# Patient Record
Sex: Female | Born: 1990 | Hispanic: Yes | Marital: Single | Smoking: Never smoker
Health system: Southern US, Community
[De-identification: ages and names within clinical notes are randomized; demographics above are authoritative.]

---
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
## 2011-04-29 ENCOUNTER — Emergency Department: Payer: Self-pay | Admitting: *Deleted

## 2011-04-29 LAB — URINALYSIS, COMPLETE
Bilirubin,UR: NEGATIVE
Ketone: NEGATIVE
Nitrite: POSITIVE
Ph: 5 (ref 4.5–8.0)
Protein: 30
RBC,UR: 58 /HPF (ref 0–5)
Specific Gravity: 1.017 (ref 1.003–1.030)
Squamous Epithelial: 13

## 2011-04-29 LAB — CBC
MCH: 29.9 pg (ref 26.0–34.0)
MCHC: 33.8 g/dL (ref 32.0–36.0)
Platelet: 216 10*3/uL (ref 150–440)
RBC: 4.7 10*6/uL (ref 3.80–5.20)
RDW: 12.8 % (ref 11.5–14.5)

## 2011-04-29 LAB — BASIC METABOLIC PANEL
Anion Gap: 11 (ref 7–16)
BUN: 13 mg/dL (ref 7–18)
Chloride: 105 mmol/L (ref 98–107)
Co2: 26 mmol/L (ref 21–32)
Osmolality: 283 (ref 275–301)
Potassium: 3.9 mmol/L (ref 3.5–5.1)

## 2011-04-29 LAB — PREGNANCY, URINE: Pregnancy Test, Urine: NEGATIVE m[IU]/mL

## 2011-05-02 LAB — URINE CULTURE

## 2017-07-26 ENCOUNTER — Ambulatory Visit (HOSPITAL_COMMUNITY)
Admission: EM | Admit: 2017-07-26 | Discharge: 2017-07-26 | Disposition: A | Discharge status: Home / Self Care | Payer: BLUE CROSS/BLUE SHIELD | Attending: Internal Medicine | Admitting: Internal Medicine

## 2017-07-26 ENCOUNTER — Other Ambulatory Visit: Payer: Self-pay

## 2017-07-26 ENCOUNTER — Encounter (HOSPITAL_COMMUNITY): Payer: Self-pay | Admitting: Emergency Medicine

## 2017-07-26 DIAGNOSIS — S61452A Open bite of left hand, initial encounter: Secondary | ICD-10-CM | POA: Diagnosis not present

## 2017-07-26 DIAGNOSIS — W5501XA Bitten by cat, initial encounter: Secondary | ICD-10-CM | POA: Diagnosis not present

## 2017-07-26 MED ORDER — DOXYCYCLINE HYCLATE 100 MG PO CAPS: 100.0000 mg | ORAL_CAPSULE | Freq: Two times a day (BID) | ORAL | 0 refills | Status: AC

## 2017-07-26 NOTE — Discharge Instructions (Signed)
Complete course of antibiotics.  Wash wound daily with soap and water. Please return if any worsening of symptoms, redness, swelling, pain, drainage

## 2017-07-26 NOTE — ED Triage Notes (Signed)
[facility] a [facility] of a cat bite to her left hand. The patient reported that it was her cat.

## 2017-07-26 NOTE — ED Provider Notes (Signed)
[facility]    CSN: [id number] Arrival date & time: 07/26/17  1840     History   Chief Complaint Chief Complaint  Patient presents with  . Animal Bite    HPI Stacy Todd is a 27 y.o. female.   Stacy Todd presents with complaints of a catbite to her left palm. She was washing her cat when it bit her this morning at 11am. cat is vaccinated. She cleansed the wound. Last tetanus was in the past year. States feels more tender to the affected area. Without contributing medical history.    ROS per HPI.      History reviewed. No pertinent past medical history.  There are no active problems to display for this patient.   History reviewed. No pertinent surgical history.  OB History   None      Home Medications    Prior to Admission medications   Medication Sig Start Date End Date Taking? Authorizing Provider  doxycycline (VIBRAMYCIN) 100 MG capsule Take 1 capsule (100 mg total) by mouth 2 (two) times daily for 7 days. 07/26/17 08/02/17  Georgetta HaberBurky, Lulla Linville B, NP    Family History History reviewed. No pertinent family history.  Social History Social History   Tobacco Use  . Smoking status: Never Smoker  . Smokeless tobacco: Never Used  Substance Use Topics  . Alcohol use: Never    Frequency: Never  . Drug use: Never     Allergies   Penicillins   Review of Systems Review of Systems   Physical Exam Triage Vital Signs ED Triage Vitals  Enc Vitals Group     BP 07/26/17 2004 (!) 129/56     Pulse Rate 07/26/17 2004 64     Resp 07/26/17 2004 16     Temp 07/26/17 2004 98.9 F (37.2 C)     Temp Source 07/26/17 2004 Oral     SpO2 07/26/17 2004 100 %     Weight --      Height --      Head Circumference --      Peak Flow --      Pain Score 07/26/17 2008 4     Pain Loc --      Pain Edu? --      Excl. in GC? --    No data found.  Updated Vital Signs BP (!) 129/56 (BP Location: Left Arm)   Pulse 64   Temp 98.9 F (37.2 C) (Oral)   Resp 16    LMP 07/20/2017   SpO2 100%   Visual Acuity Right Eye Distance:   Left Eye Distance:   Bilateral Distance:    Right Eye Near:   Left Eye Near:    Bilateral Near:     Physical Exam  Constitutional: She is oriented to person, place, and time. She appears well-developed and well-nourished. No distress.  Cardiovascular: Normal rate, regular rhythm and normal heart sounds.  Pulmonary/Chest: Effort normal and breath sounds normal.  Musculoskeletal:       Hands: Approximately 1cm superficial cut to palm of hand. Without any depth; no active bleeding; full ROM to hand, fingers, wrist; cap refill <2 seconds  Neurological: She is alert and oriented to person, place, and time.  Skin: Skin is warm and dry.     UC Treatments / Results  Labs (all labs ordered are listed, but only abnormal results are displayed) Labs Reviewed - No data to display  EKG None  Radiology No results found.  Procedures Procedures (including  critical care time)  Medications Ordered in UC Medications - No data to display  Initial Impression / Assessment and Plan / UC Course  I have reviewed the triage vital signs and the nursing notes.  Pertinent labs & imaging results that were available during my care of the patient were reviewed by me and considered in my medical decision making (see chart for details).     Prophylactic augmentin prescribed with strict return precautions discussed. Wound care discussed. Patient verbalized understanding and agreeable to plan.    Final Clinical Impressions(s) / UC Diagnoses   Final diagnoses:  Cat bite, initial encounter     Discharge Instructions     Complete course of antibiotics.  Wash wound daily with soap and water. Please return if any worsening of symptoms, redness, swelling, pain, drainage   ED Prescriptions    Medication Sig Dispense Auth. Provider   doxycycline (VIBRAMYCIN) 100 MG capsule Take 1 capsule (100 mg total) by mouth 2 (two) times daily  for 7 days. 14 capsule Georgetta Haber, NP     Controlled Substance Prescriptions [place] Controlled Substance Registry consulted? Not Applicable   Georgetta Haber, NP 07/26/17 2038

## 2020-09-22 ENCOUNTER — Other Ambulatory Visit: Payer: Self-pay | Admitting: Orthopedic Surgery

## 2020-09-22 ENCOUNTER — Other Ambulatory Visit (HOSPITAL_COMMUNITY): Payer: Self-pay | Admitting: Orthopedic Surgery

## 2020-09-22 DIAGNOSIS — S8391XD Sprain of unspecified site of right knee, subsequent encounter: Secondary | ICD-10-CM

## 2020-09-22 DIAGNOSIS — M2351 Chronic instability of knee, right knee: Secondary | ICD-10-CM

## 2020-09-22 DIAGNOSIS — M2391 Unspecified internal derangement of right knee: Secondary | ICD-10-CM

## 2020-10-03 ENCOUNTER — Other Ambulatory Visit: Payer: Self-pay

## 2020-10-03 ENCOUNTER — Ambulatory Visit
Admission: RE | Admit: 2020-10-03 | Discharge: 2020-10-03 | Disposition: A | Discharge status: Home / Self Care | Payer: BC Managed Care – PPO | Source: Ambulatory Visit | Attending: Orthopedic Surgery | Admitting: Orthopedic Surgery

## 2020-10-03 DIAGNOSIS — S8391XD Sprain of unspecified site of right knee, subsequent encounter: Secondary | ICD-10-CM | POA: Diagnosis not present

## 2020-10-03 DIAGNOSIS — M2351 Chronic instability of knee, right knee: Secondary | ICD-10-CM | POA: Insufficient documentation

## 2020-10-03 DIAGNOSIS — M2391 Unspecified internal derangement of right knee: Secondary | ICD-10-CM | POA: Insufficient documentation

## 2022-01-24 ENCOUNTER — Other Ambulatory Visit: Payer: Self-pay

## 2022-01-24 IMAGING — MR MR KNEE*R* W/O CM
7 series · 40 of 40 positions shown · non-contrast
Comparison: None.

CLINICAL DATA: Anterior knee pain with popping for 2 weeks. No
known injury.

EXAM:
MRI OF THE RIGHT KNEE WITHOUT CONTRAST
TECHNIQUE: Multiplanar, multisequence MR imaging of the knee was performed. No
intravenous contrast was administered.

[Series 8: T2 fat-sat · axial · right · 4.0mm · 0.50mm/px · z∈[-56,+68]mm · 6 of 26 slices shown (1 of 3)]
[im 1/26]
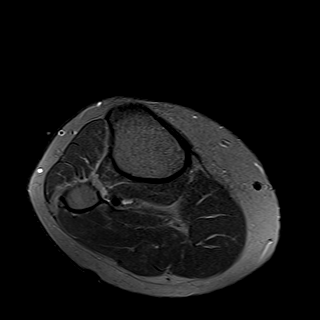
[im 6/26]
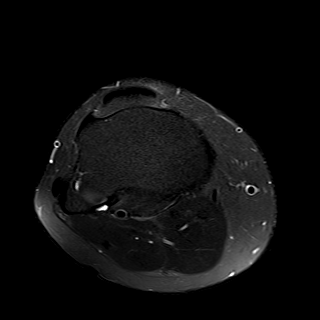
[im 11/26]
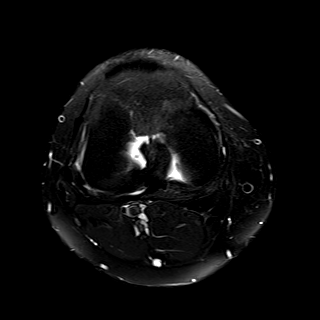
[im 16/26]
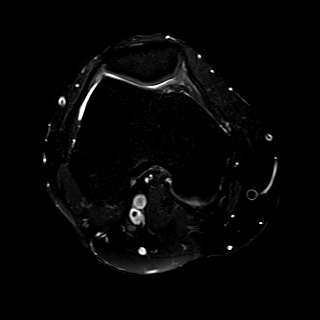
[im 21/26]
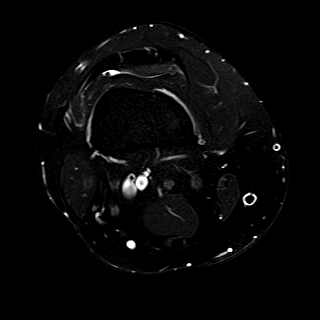
[im 26/26]
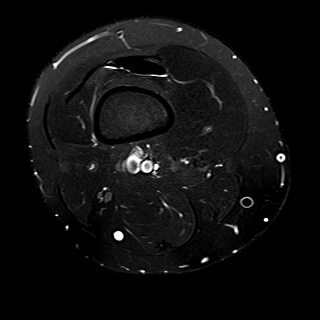

[Series 9: T1 · coronal · right · 4.0mm · 0.47mm/px · 6 of 30 slices shown]
[im 1/30]
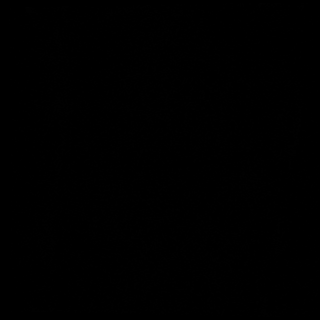
[im 6/30]
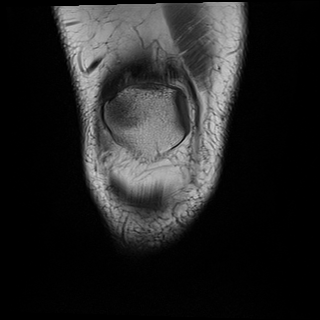
[im 12/30]
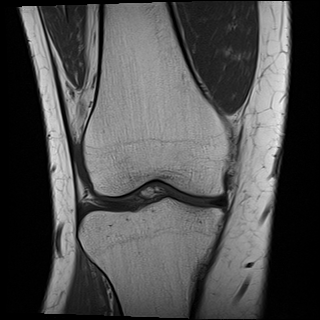
[im 18/30]
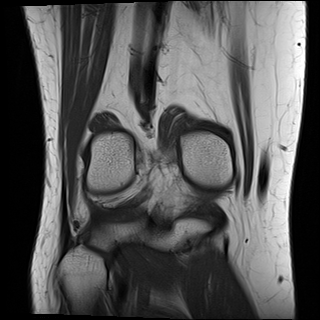
[im 24/30]
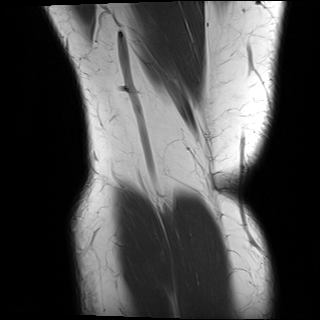
[im 30/30]
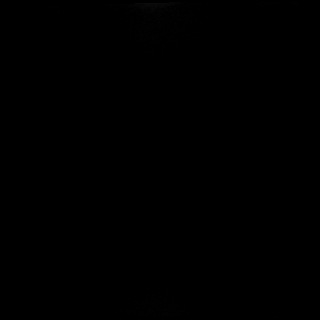

[Series 10: T2 fat-sat · coronal · right · 4.0mm · 0.47mm/px · 6 of 29 slices shown (2 of 3)]
[im 1/29]
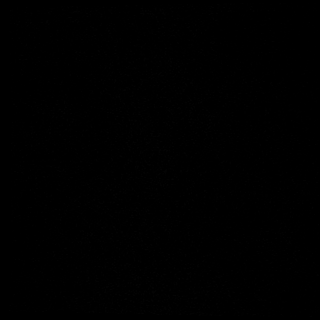
[im 6/29]
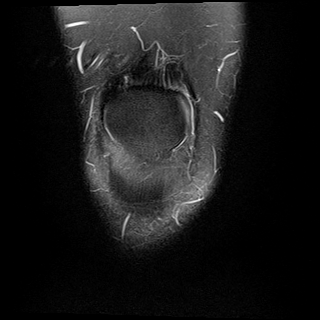
[im 12/29]
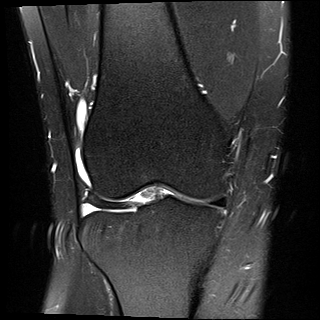
[im 17/29]
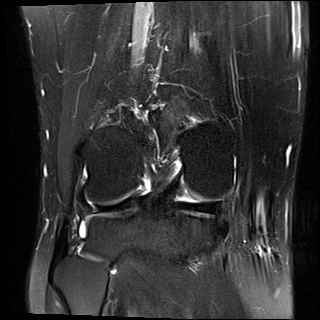
[im 23/29]
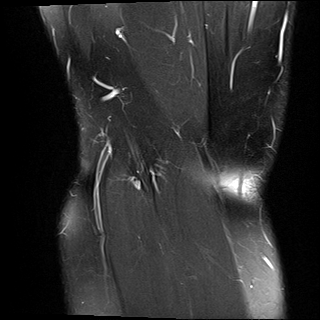
[im 29/29]
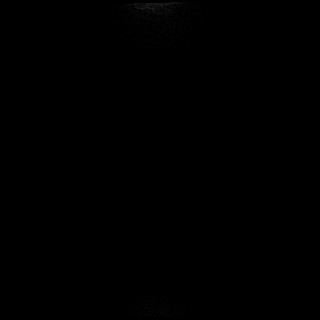

[Series 11: PD fat-sat · coronal · right · 4.0mm · 0.59mm/px · 6 of 29 slices shown (1 of 2)]
[im 1/29]
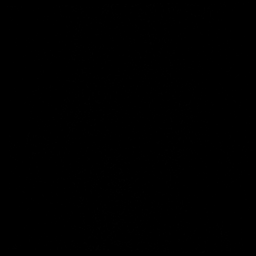
[im 6/29]
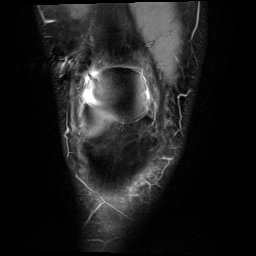
[im 12/29]
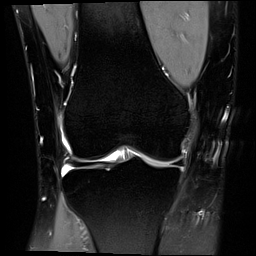
[im 17/29]
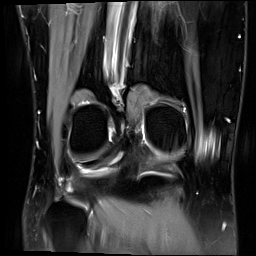
[im 23/29]
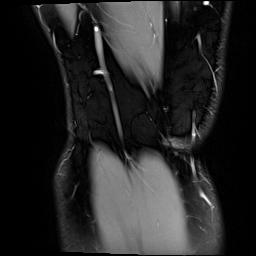
[im 29/29]
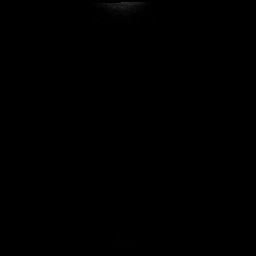

[Series 12: PD fat-sat · sagittal · right · 3.0mm · 0.47mm/px · 7 of 31 slices shown (2 of 2)]
[im 1/31]
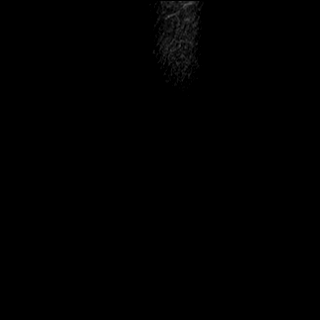
[im 6/31]
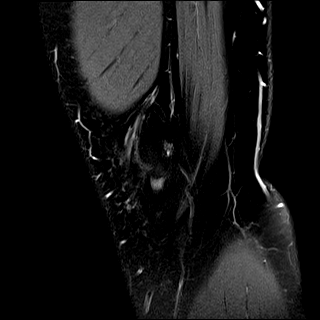
[im 11/31]
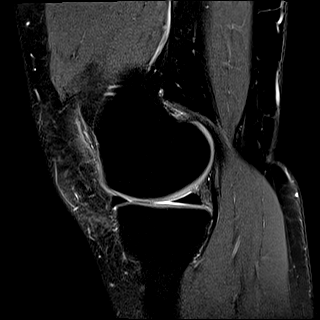
[im 16/31]
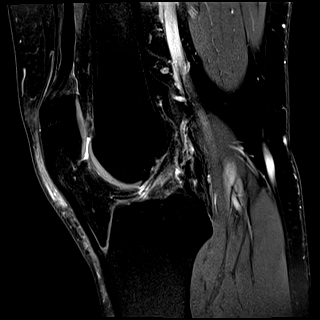
[im 21/31]
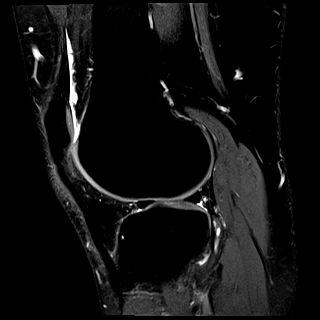
[im 26/31]
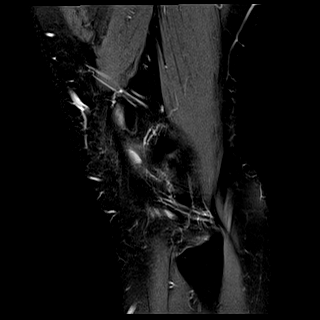
[im 31/31]
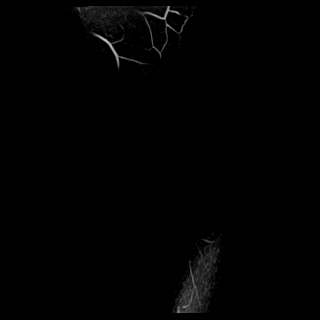

[Series 13: T2 fat-sat · sagittal · right · 3.0mm · 0.47mm/px · 7 of 34 slices shown (3 of 3)]
[im 1/34]
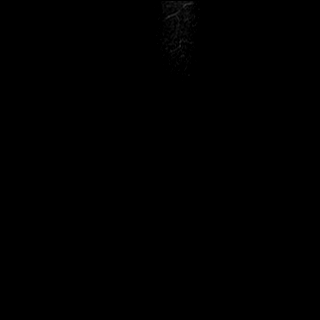
[im 6/34]
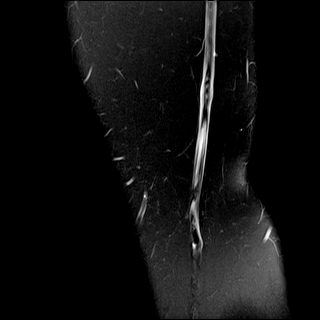
[im 12/34]
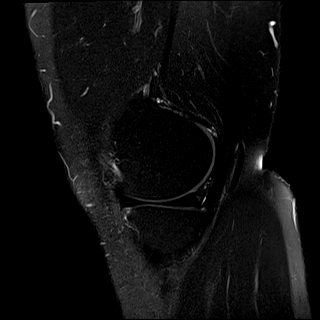
[im 17/34]
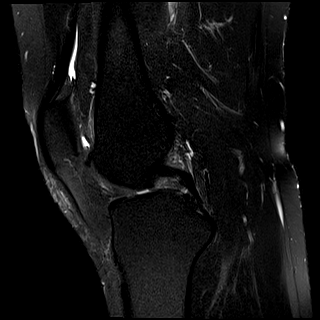
[im 23/34]
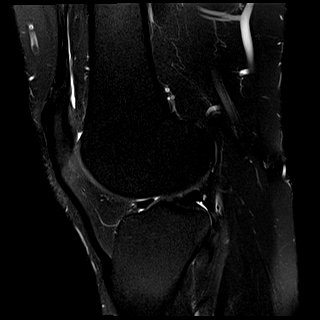
[im 28/34]
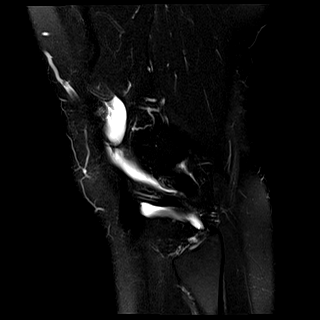
[im 34/34]
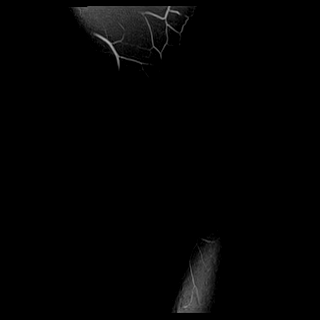

[Series 14: PD · oblique · right · 2.0mm · 0.47mm/px · 2 of 10 slices shown]
[im 1/10]
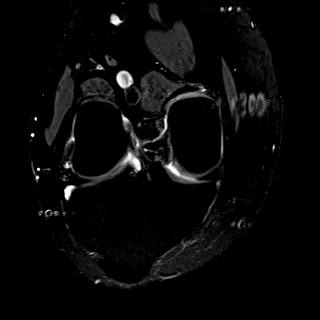
[im 10/10]
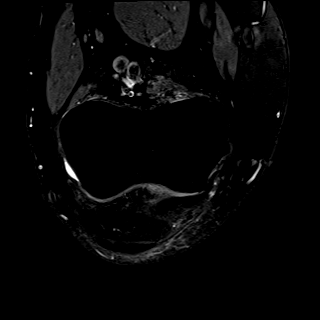

[40 of 40 positions shown; findings below may reference images not displayed]

FINDINGS: MENISCI

Medial meniscus:  Intact with normal morphology.

Lateral meniscus:  Intact with normal morphology.

LIGAMENTS

Cruciates:  Intact.

Collaterals:  Intact.

CARTILAGE

Patellofemoral:  Preserved.

Medial:  Preserved.

Lateral:  Preserved.

MISCELLANEOUS

Joint:  No significant joint effusion.

Popliteal Fossa:  Unremarkable. No significant Baker's cyst.

Extensor Mechanism:  Intact.

Bones:  No acute or significant extra-articular osseous findings.

Other: No other significant periarticular soft tissue findings.
IMPRESSION: Normal examination.

## 2022-01-24 MED ORDER — AZITHROMYCIN 250 MG PO TABS
ORAL_TABLET | ORAL | 0 refills | Status: AC
  Filled 2022-01-24: qty 6, 5d supply, fill #0

## 2022-01-24 MED ORDER — PROMETHAZINE-DM 6.25-15 MG/5ML PO SYRP
ORAL_SOLUTION | ORAL | 0 refills | Status: AC
  Filled 2022-01-24: qty 118, 6d supply, fill #0

## 2022-01-24 MED ORDER — FLUTICASONE PROPIONATE 50 MCG/ACT NA SUSP
NASAL | 0 refills | Status: AC
  Filled 2022-01-24: qty 16, 30d supply, fill #0

## 2022-01-24 MED ORDER — ALBUTEROL SULFATE HFA 108 (90 BASE) MCG/ACT IN AERS
INHALATION_SPRAY | RESPIRATORY_TRACT | 0 refills | Status: AC
  Filled 2022-01-24: qty 6.7, 30d supply, fill #0

## 2022-01-24 MED ORDER — PREDNISONE 20 MG PO TABS
ORAL_TABLET | ORAL | 0 refills | Status: AC
  Filled 2022-01-24: qty 5, 5d supply, fill #0
